# Patient Record
Sex: Male | Born: 1985 | Race: White | Hispanic: No | Marital: Single | Smoking: Never smoker
Health system: Southern US, Community
[De-identification: ages and names within clinical notes are randomized; demographics above are authoritative.]

## PROBLEM LIST (undated history)

## (undated) DIAGNOSIS — F988 Other specified behavioral and emotional disorders with onset usually occurring in childhood and adolescence: Secondary | ICD-10-CM

## (undated) DIAGNOSIS — S4990XA Unspecified injury of shoulder and upper arm, unspecified arm, initial encounter: Secondary | ICD-10-CM

## (undated) DIAGNOSIS — F329 Major depressive disorder, single episode, unspecified: Secondary | ICD-10-CM

## (undated) DIAGNOSIS — F32A Depression, unspecified: Secondary | ICD-10-CM

## (undated) HISTORY — PX: SHOULDER ARTHROSCOPY: SHX128

## (undated) HISTORY — DX: Unspecified injury of shoulder and upper arm, unspecified arm, initial encounter: S49.90XA

## (undated) HISTORY — DX: Other specified behavioral and emotional disorders with onset usually occurring in childhood and adolescence: F98.8

## (undated) HISTORY — DX: Major depressive disorder, single episode, unspecified: F32.9

## (undated) HISTORY — DX: Depression, unspecified: F32.A

---
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
## 2007-02-10 ENCOUNTER — Ambulatory Visit: Payer: Self-pay | Admitting: Family Medicine

## 2007-02-11 ENCOUNTER — Encounter: Admission: RE | Admit: 2007-02-11 | Discharge: 2007-02-11 | Payer: Self-pay | Admitting: Family Medicine

## 2007-03-19 DIAGNOSIS — J45990 Exercise induced bronchospasm: Secondary | ICD-10-CM

## 2007-04-16 ENCOUNTER — Encounter (INDEPENDENT_AMBULATORY_CARE_PROVIDER_SITE_OTHER): Payer: Self-pay | Admitting: Family Medicine

## 2007-06-03 ENCOUNTER — Ambulatory Visit: Payer: Self-pay | Admitting: Family Medicine

## 2007-06-03 DIAGNOSIS — R519 Headache, unspecified: Secondary | ICD-10-CM | POA: Insufficient documentation

## 2007-06-03 DIAGNOSIS — R55 Syncope and collapse: Secondary | ICD-10-CM

## 2007-06-03 DIAGNOSIS — R51 Headache: Secondary | ICD-10-CM

## 2007-06-27 ENCOUNTER — Ambulatory Visit: Payer: Self-pay | Admitting: Family Medicine

## 2007-06-27 DIAGNOSIS — F329 Major depressive disorder, single episode, unspecified: Secondary | ICD-10-CM | POA: Insufficient documentation

## 2007-07-09 ENCOUNTER — Telehealth (INDEPENDENT_AMBULATORY_CARE_PROVIDER_SITE_OTHER): Payer: Self-pay | Admitting: *Deleted

## 2007-07-14 ENCOUNTER — Encounter (INDEPENDENT_AMBULATORY_CARE_PROVIDER_SITE_OTHER): Payer: Self-pay | Admitting: Family Medicine

## 2010-06-09 ENCOUNTER — Ambulatory Visit: Payer: Self-pay | Admitting: Family Medicine

## 2010-06-09 DIAGNOSIS — F988 Other specified behavioral and emotional disorders with onset usually occurring in childhood and adolescence: Secondary | ICD-10-CM | POA: Insufficient documentation

## 2010-06-09 DIAGNOSIS — M6740 Ganglion, unspecified site: Secondary | ICD-10-CM | POA: Insufficient documentation

## 2010-07-03 DIAGNOSIS — Z9189 Other specified personal risk factors, not elsewhere classified: Secondary | ICD-10-CM | POA: Insufficient documentation

## 2010-10-26 ENCOUNTER — Ambulatory Visit
Admission: RE | Admit: 2010-10-26 | Discharge: 2010-10-26 | Payer: Self-pay | Source: Home / Self Care | Attending: Family Medicine | Admitting: Family Medicine

## 2010-11-05 ENCOUNTER — Encounter: Payer: Self-pay | Admitting: Family Medicine

## 2010-11-14 NOTE — Assessment & Plan Note (Signed)
Summary: CIST IN RIBS & TALK ABOUT MEDS / LFW   Vital Signs:  Patient profile:   25 year old male Height:      82 inches Weight:      255 pounds BMI:     26.76 Temp:     97.2 degrees F oral Pulse rate:   92 / minute Pulse rhythm:   regular BP sitting:   122 / 80  (left arm) Cuff size:   large  Vitals Entered By: Delilah Shan CMA Duncan Dull) (June 09, 2010 9:19 AM) CC: Cyst on wrist and talk about meds   History of Present Illness: Depression- longstanding for patient.  In therapy and doing much better on SSRI.  No SI/HI.  Sig prev upheaval when patient lost possible scholarships due to injury.  Also with uncle dead of suicide.  +FH of depression, other members of family have responded to celexa.  Winter tends to be worse for patient, but he is doing well now.  Work is going well for the patient.   ADD- prev tested and prev with good relief with meds.  Was on vyvanse prev.  No h/o abuse, misuse.  No illicit use.  Meds had prev helped with focus and were tolerated well.  No h/o HTN.  H/o speeding tickets; h/o lack of focus that is long standing and typical of ADD.  asking about starting back on meds.  The plan was to get MDD under better control and then address this.    H/o bilateral ganglion cysts on wrists.  Prev were inflammed and enlarged.  Recently decreased in size.  Asking about options.   Allergies (verified): No Known Drug Allergies  Past History:  Past Medical History: Depression ADD Shoulder injury  Family History: Reviewed history and no changes required. F alive, DM2, HTN, HLD, depression M alive Uncle dead, suicide  Social History: Reviewed history from 03/19/2007 and no changes required. Single Student Working at [employer], smoking. H/o dip tobacco. rare alcohol Was planning on attending college with basketball scholarship when shoulder injury happened.  [employer] fan  Review of Systems       See HPI.  Otherwise negative.    Physical  Exam  General:  GEN: nad, alert and oriented, affect wnl and appropriate HEENT: mucous membranes moist NECK: supple w/o LA CV: rrr.  PULM: ctab, no inc wob ABD: soft, +bs EXT: no edema CN 2-12 wnl, s/s/dtr wnl x4.  No tremor.   bilateral small ganglion cysts noted on the wrists, L>R.    Impression & Recommendations:  Problem # 1:  ADD (ICD-314.00) Will start vyvanse 10mg  by mouth qday and follow up in 1 month.  routine precautions given, esp UJ:WJXB changes.    Problem # 2:  DEPRESSION (ICD-311) continue celexa.  No change for now.  Will likely need to increase dose in the fall before winter symptoms change.  No SI/HI.  continue therapy. The following medications were removed from the medication list:    Zoloft 25 Mg Tabs (Sertraline hcl) .Marland Kitchen... Take 1 tablet by mouth once a day His updated medication list for this problem includes:    Celexa 20 Mg Tabs (Citalopram hydrobromide) .Marland Kitchen... 1 by mouth qday  Problem # 3:  GANGLION CYST (ICD-727.43) If they enlarge again, patient can call back and we'll refer to hand center via phone.   Complete Medication List: 1)  Celexa 20 Mg Tabs (Citalopram hydrobromide) .Marland Kitchen.. 1 by mouth qday 2)  Vyvanse 20 Mg Caps (Lisdexamfetamine  dimesylate) .Marland Kitchen.. 1 by mouth qday  Patient Instructions: 1)  Let me know if the cysts get bigger.  I want to see you back in 1 month. Let me know if you have any mood changes on the vyvanse.  Take care.  Prescriptions: VYVANSE 20 MG CAPS (LISDEXAMFETAMINE DIMESYLATE) 1 by mouth qday  #30 x 0   Entered and Authorized by:   Crawford Givens MD   Signed by:   Crawford Givens MD on 06/09/2010   Method used:   Print then Give to Patient   RxID:   [id number] CELEXA 20 MG TABS (CITALOPRAM HYDROBROMIDE) 1 by mouth qday  #90 x 3   Entered and Authorized by:   Crawford Givens MD   Signed by:   Crawford Givens MD on 06/09/2010   Method used:   Print then Give to Patient   RxID:   [id number]   Current Allergies (reviewed  today): No known allergies

## 2010-11-16 NOTE — Assessment & Plan Note (Signed)
Summary: COUGH,CONGESTION/CLE   Vital Signs:  Patient profile:   25 year old male Height:      82 inches Weight:      268.75 pounds BMI:     28.20 Temp:     97.8 degrees F oral Pulse rate:   80 / minute Pulse rhythm:   regular BP sitting:   98 / 68  (left arm) Cuff size:   large  Vitals Entered By: Delilah Shan CMA (AAMA) (October 26, 2010 9:30 AM) CC: Cough, congestion   History of Present Illness: "I though it was allergies, it was at random times but now it's more constant."  Waking up wheezing.  Has coughed a lot per patient, increase over last 2 weeks.  Using SABA frequently, with transient relief.  No smoking.  No FCNAV.  No abdominal pain.  Some HA recently.  Normally dry cough. Cough/wheeze worse at night.  Sx going on for about 1 month.    Allergies: No Known Drug Allergies  Past History:  Past Medical History: Depression ADD Shoulder injury H/o asthma  Review of Systems       See HPI.  Otherwise negative.    Physical Exam  General:  no apparent distress normocephalic atraumatic mucous membranes moist neck supple w/o LA regular rate and rhythm clear to auscultation bilaterally w/o wheeze noted, no inc in wob, dry cough noted abdomen soft, not tender to palpation ext w/o edema ext well perfused   Impression & Recommendations:  Problem # 1:  COUGH (ICD-786.2) He has a likely RAD exacerbation.  Lungs clear to auscultation bilaterally today.  Samples of symbicort given to use in meantime 160/4.5.  1 puff two times a day and use SABA as needed.  He'll call back with update.  he used first dose of symbicort here and cough was improved.  I talked with him about preventive vs abortive tx and he understood.  I see note reason to suspect a bacterial infection, no indication for antibiotics.  he is well appearing and doesn't appear to need systemic steroids.  follow up as needed.   Orders: Prescription Created Electronically [phone])  Complete Medication  List: 1)  Celexa 20 Mg Tabs (Citalopram hydrobromide) .Marland Kitchen.. 1 by mouth qday 2)  Vyvanse 20 Mg Caps (Lisdexamfetamine dimesylate) .Marland Kitchen.. 1 by mouth qday 3)  Symbicort 160-4.5 Mcg/act Aero (Budesonide-formoterol fumarate) .Marland Kitchen.. 1 puff two times a day 4)  Ventolin Hfa 108 (90 Base) Mcg/act Aers (Albuterol sulfate) .... 2 puffs q4h as needed for cough/wheeze  Patient Instructions: 1)  Use the symbicort two times a day and rinse after use.  Use the ventolin/albuterol every 4 hours as needed and let me know how you are doing in a few days.  Take care.   Prescriptions: VENTOLIN HFA 108 (90 BASE) MCG/ACT AERS (ALBUTEROL SULFATE) 2 puffs q4h as needed for cough/wheeze  #1 x 5   Entered and Authorized by:   Crawford Givens MD   Signed by:   Crawford Givens MD on 10/26/2010   Method used:   Electronically to        [facility] (retail)       [address]       [place], [place]  [address]       Ph: [phone]       Fax: [phone]   RxID:   [id number]    Orders Added: 1)  Est. Patient Level III [44010] 2)  Prescription Created  Electronically [phone]    Current Allergies (reviewed today): No known allergies

## 2011-03-02 NOTE — Assessment & Plan Note (Signed)
[facility]                        [facility] OFFICE NOTE   CARRY, ORTEZ                      MRN:          [id number]  DATE:02/10/2007                            DOB:          02-13-1986    REASON FOR VISIT:  Right shoulder pain.   Derek Briggs is an 25 year old male presenting today complaining of right  shoulder pain.  He reports that it has been present for 3 years, over  the last several months it has become a constant discomfort.  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  He does report that the  pain is definitely amplified with significant physical activity.  Sometimes he has difficulty getting his right arm over his head if he  does a lot of repetitive movement.  The pain initially started in the  anterior shoulder, now goes to the posterior shoulder into the trapezius  muscle.  He denies any obvious trauma.  He is not taking any medications  for it.  Occasionally he would ice it.   PAST MEDICAL HISTORY:  Asthma, mild, intermittent.   MEDICATIONS:  None.   ALLERGIES:  No drug allergies.   REVIEW OF SYSTEMS:  As per HPI.   OBJECTIVE:  Weight 201, temperature 97.9, pulse 72, blood pressure  122/80.  GENERAL:  A pleasant male in no acute distress.  RIGHT SHOULDER:  Examination significant for no obvious deformity on  physical inspection.  Palpation of the anterior, posterior shoulder  significant for no palpable masses, nontender to palpation.  External  rotation of the shoulder causes discomfort in posterior shoulder and  resisted abduction causes discomfort in the anterior shoulder. Strength  is 5 out of 5.  Neurovascularly intact.   IMPRESSION:  An 25 year old male with chronic right shoulder pain,  consistent with rotator cuff tendinopathy.  Need to rule out potential  rotator cuff tear.   PLAN:  We will obtain an x-ray of the shoulder, as well as the C-spine  to rule out obvious skeletal pathology.  Subsequently we will refer the  patient for an MRI  versus referral to after review of the x-ray.     Leanne Chang, M.D.  Electronically Signed    LA/MedQ  DD: 02/10/2007  DT: 02/11/2007  Job #: [id number]

## 2011-04-11 ENCOUNTER — Encounter: Payer: Self-pay | Admitting: Family Medicine

## 2011-04-11 ENCOUNTER — Ambulatory Visit (INDEPENDENT_AMBULATORY_CARE_PROVIDER_SITE_OTHER): Payer: 59 | Admitting: Family Medicine

## 2011-04-11 VITALS — BP 110/80 | HR 98 | Temp 98.2°F | Ht >= 80 in | Wt 267.0 lb

## 2011-04-11 DIAGNOSIS — J029 Acute pharyngitis, unspecified: Secondary | ICD-10-CM

## 2011-04-11 NOTE — Progress Notes (Signed)
Subjective:     Derek Briggs is a 25 y.o. male who presents for evaluation of sore throat. Associated symptoms include chills, headache, myalgias, post nasal drip, sinus and nasal congestion and sore throat. Onset of symptoms was 3 days ago, and have been unchanged since that time. He is drinking plenty of fluids. He has had a recent close exposure to someone with proven streptococcal pharyngitis (girlfriend).   Review of Systems Pertinent items are noted in HPI.  No fevers, nasal congestion, cough or chest pain. Pos nausea, no vomiting, no rashes  Patient Active Problem List  Diagnoses  . DEPRESSION  . ADD  . ASTHMA, EXERCISE INDUCED BRONCHOSPASM  . GANGLION CYST  . SYNCOPE  . HEADACHE  . CHICKENPOX, HX OF   Past Medical History  Diagnosis Date  . Depression   . ADD (attention deficit disorder)   . Shoulder injury   . Asthma    No past surgical history on file. History  Substance Use Topics  . Smoking status: Never Smoker   . Smokeless tobacco: Not on file  . Alcohol Use: Yes   Family History  Problem Relation Age of Onset  . Diabetes Father   . Hypertension Father   . Depression Father    The PMH, PSH, Social History, Family History, Medications, and allergies have been reviewed in [facility], and have been updated if relevant.     Objective:  BP 110/80  Pulse 98  Temp(Src) 98.2 F (36.8 C) (Oral)  Ht 6\' 10"  (2.083 m)  Wt 267 lb (121.11 kg)  BMI 27.92 kg/m2   BP 110/80  Pulse 98  Temp(Src) 98.2 F (36.8 C) (Oral)  Ht 6\' 10"  (2.083 m)  Wt 267 lb (121.11 kg)  BMI 27.92 kg/m2  General Appearance:    Alert, cooperative, no distress, appears stated age  Head:    Normocephalic, without obvious abnormality, atraumatic  Eyes:    PERRL, conjunctiva/corneas clear, EOM's intact, fundi    benign, both eyes       Ears:    Normal TM's and external ear canals, both ears  Nose:   Nares normal, septum midline, mucosa normal, no drainage    or sinus tenderness  Throat:    Lips, mucosa, and tongue normal; teeth and gums normal +erythema, no exudate  Neck:   Supple, symmetrical, trachea midline, no adenopathy;       thyroid:  No enlargement/tenderness/nodules; no carotid   bruit or JVD  Back:     Symmetric, no curvature, ROM normal, no CVA tenderness  Lungs:     Clear to auscultation bilaterally, respirations unlabored  Chest wall:    No tenderness or deformity  Heart:    Regular rate and rhythm, S1 and S2 normal, no murmur, rub   or gallop  Abdomen:     Soft, non-tender, bowel sounds active all four quadrants,    no masses, no organomegaly     Extremities:   Extremities normal, atraumatic, no cyanosis or edema  Pulses:   2+ and symmetric all extremities  Skin:   Skin color, texture, turgor normal, no rashes or lesions  Lymph nodes:   Cervical, supraclavicular, and axillary nodes normal  Neurologic:   CNII-XII intact. Normal strength, sensation and reflexes      throughout    Laboratory Strep test done. Results:negative.    Assessment:    Acute pharyngitis, likely  Viral pharyngitis.    Plan:    Use of OTC analgesics recommended as well as salt  water gargles. Follow up as needed.

## 2011-04-11 NOTE — Progress Notes (Signed)
Addended by: Gilmer Mor on: 04/11/2011 10:00 AM   Modules accepted: Orders

## 2011-04-12 ENCOUNTER — Telehealth: Payer: Self-pay | Admitting: *Deleted

## 2011-04-12 NOTE — Telephone Encounter (Signed)
I called and LMOVM for pt.  Will try again later.

## 2011-04-12 NOTE — Telephone Encounter (Signed)
Pt was seen yesterday for sore throat, he says he is not any better today and is asking that antibiotic be called to [facility].  He is willing to come back in for recheck, but there are no appts available today.  He doesn't sound like he feels well at all.

## 2011-04-12 NOTE — Telephone Encounter (Signed)
I called pt.  He doesn't sound toxic.  He does have h/o mono.  He's okay with coming in tomorrow.  I'll check him then.

## 2011-04-12 NOTE — Telephone Encounter (Signed)
Pt called back, he would like to be seen tomorrow, appt scheduled.

## 2011-04-13 ENCOUNTER — Encounter: Payer: Self-pay | Admitting: Family Medicine

## 2011-04-13 ENCOUNTER — Ambulatory Visit (INDEPENDENT_AMBULATORY_CARE_PROVIDER_SITE_OTHER): Payer: 59 | Admitting: Family Medicine

## 2011-04-13 VITALS — BP 120/80 | HR 76 | Temp 98.1°F | Resp 18 | Wt 266.0 lb

## 2011-04-13 DIAGNOSIS — J329 Chronic sinusitis, unspecified: Secondary | ICD-10-CM

## 2011-04-13 MED ORDER — AMOXICILLIN 875 MG PO TABS: 875.0000 mg | ORAL_TABLET | Freq: Two times a day (BID) | ORAL | Status: AC

## 2011-04-13 NOTE — Progress Notes (Signed)
URI sx.  Had chills, headache, myalgias, post nasal drip, sinus and nasal congestion and sore throat. Onset of symptoms was 5 days ago.  Still with sweats at night and chills, too.  He feels like his is equilibrium is off.  B temple pain.  Sinus pain and pressure, worse in the meantime.  No dip since Sunday.  He had mono before and had a lot of abdominal pain with that, but no sx like that now.  He feels worse than Wednesday overall, but the ST is some better.    Meds, vitals, and allergies reviewed.   ROS: See HPI.  Otherwise, noncontributory.  GEN: nad, alert and oriented HEENT: mucous membranes moist, tm w/o erythema but are retracted, nasal exam w/mild erythema, clear discharge noted,  OP with cobblestoning, frontal sinuses ttp medially NECK: supple w/ shotty LA CV: rrr.   PULM: ctab, no inc wob ABD: soft, not ttp, normal BS EXT: no edema SKIN: no acute rash

## 2011-04-13 NOTE — Patient Instructions (Signed)
Drink plenty of fluids, take tylenol as needed, and gargle with warm salt water for your throat.  Start the amoxil today.  This should gradually improve.  Take care.  Let us know if you have other concerns.

## 2011-04-15 ENCOUNTER — Encounter: Payer: Self-pay | Admitting: Family Medicine

## 2011-04-15 DIAGNOSIS — J329 Chronic sinusitis, unspecified: Secondary | ICD-10-CM | POA: Insufficient documentation

## 2011-04-15 NOTE — Assessment & Plan Note (Signed)
He has progression of symptoms with sinus tenderness.  I doubt mono. I would start the abx and supportive tx o/w.  He agrees.  D/w pt about ddx and he understood.  Nontoxic.

## 2011-12-04 ENCOUNTER — Ambulatory Visit (INDEPENDENT_AMBULATORY_CARE_PROVIDER_SITE_OTHER): Payer: 59 | Admitting: Family Medicine

## 2011-12-04 ENCOUNTER — Ambulatory Visit (INDEPENDENT_AMBULATORY_CARE_PROVIDER_SITE_OTHER)
Admission: RE | Admit: 2011-12-04 | Discharge: 2011-12-04 | Disposition: A | Discharge status: Home / Self Care | Payer: 59 | Source: Ambulatory Visit | Attending: Family Medicine | Admitting: Family Medicine

## 2011-12-04 ENCOUNTER — Encounter: Payer: Self-pay | Admitting: Family Medicine

## 2011-12-04 DIAGNOSIS — M25539 Pain in unspecified wrist: Secondary | ICD-10-CM

## 2011-12-04 DIAGNOSIS — J45990 Exercise induced bronchospasm: Secondary | ICD-10-CM

## 2011-12-04 DIAGNOSIS — M25519 Pain in unspecified shoulder: Secondary | ICD-10-CM

## 2011-12-04 DIAGNOSIS — F988 Other specified behavioral and emotional disorders with onset usually occurring in childhood and adolescence: Secondary | ICD-10-CM

## 2011-12-04 MED ORDER — ALBUTEROL SULFATE HFA 108 (90 BASE) MCG/ACT IN AERS: 2.0000 | INHALATION_SPRAY | RESPIRATORY_TRACT | Status: DC | PRN

## 2011-12-04 MED ORDER — LISDEXAMFETAMINE DIMESYLATE 20 MG PO CAPS: 20.0000 mg | ORAL_CAPSULE | Freq: Every day | ORAL | Status: DC

## 2011-12-04 MED ORDER — BUDESONIDE-FORMOTEROL FUMARATE 160-4.5 MCG/ACT IN AERO: 1.0000 | INHALATION_SPRAY | Freq: Two times a day (BID) | RESPIRATORY_TRACT | Status: DC
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
## 2011-12-04 NOTE — Patient Instructions (Signed)
Use and over the counter wrist brace and take 600mg  of ibuprofen 3 times a day with food.  This should get better.  Take care.

## 2011-12-06 DIAGNOSIS — M25539 Pain in unspecified wrist: Secondary | ICD-10-CM | POA: Insufficient documentation

## 2011-12-06 DIAGNOSIS — M25519 Pain in unspecified shoulder: Secondary | ICD-10-CM | POA: Insufficient documentation

## 2011-12-06 NOTE — Assessment & Plan Note (Signed)
No fx seen, use otc cock up brace and activity limited by pain.  Ibuprofen prn with GI caution.  F/u prn.  Xray w/o acute changes.

## 2011-12-06 NOTE — Assessment & Plan Note (Signed)
Controlled with meds, continue as is, no change.

## 2011-12-06 NOTE — Assessment & Plan Note (Signed)
Controlled, continue as is, no change in meds.  

## 2011-12-06 NOTE — Assessment & Plan Note (Signed)
Likely strained, but xray reviewed and no acute changes.  Use ibuprofen prn and f/u prn.  Activity to be limited by pain.

## 2011-12-14 ENCOUNTER — Encounter: Payer: 59 | Admitting: Family Medicine

## 2012-01-30 ENCOUNTER — Other Ambulatory Visit: Payer: Self-pay | Admitting: Orthopedic Surgery

## 2012-01-30 ENCOUNTER — Ambulatory Visit
Admission: RE | Admit: 2012-01-30 | Discharge: 2012-01-30 | Disposition: A | Discharge status: Home / Self Care | Payer: 59 | Source: Ambulatory Visit | Attending: Orthopedic Surgery | Admitting: Orthopedic Surgery

## 2012-01-30 DIAGNOSIS — R52 Pain, unspecified: Secondary | ICD-10-CM

## 2012-01-31 ENCOUNTER — Encounter (HOSPITAL_COMMUNITY): Payer: Self-pay | Admitting: Pharmacy Technician

## 2012-01-31 ENCOUNTER — Encounter (HOSPITAL_COMMUNITY)
Admission: RE | Admit: 2012-01-31 | Discharge: 2012-01-31 | Disposition: A | Discharge status: Home / Self Care | Payer: 59 | Source: Ambulatory Visit | Attending: Anesthesiology | Admitting: Anesthesiology

## 2012-01-31 ENCOUNTER — Other Ambulatory Visit: Payer: Self-pay | Admitting: Orthopedic Surgery

## 2012-01-31 ENCOUNTER — Encounter (HOSPITAL_COMMUNITY): Payer: Self-pay

## 2012-01-31 ENCOUNTER — Encounter (HOSPITAL_COMMUNITY)
Admission: RE | Admit: 2012-01-31 | Discharge: 2012-01-31 | Disposition: A | Discharge status: Home / Self Care | Payer: 59 | Source: Ambulatory Visit | Attending: Orthopedic Surgery | Admitting: Orthopedic Surgery

## 2012-01-31 LAB — CBC
Hemoglobin: 15.2 g/dL (ref 13.0–17.0)
MCH: 29.9 pg (ref 26.0–34.0)
MCHC: 34.3 g/dL (ref 30.0–36.0)
MCV: 87 fL (ref 78.0–100.0)
RBC: 5.09 MIL/uL (ref 4.22–5.81)

## 2012-01-31 LAB — SURGICAL PCR SCREEN: MRSA, PCR: NEGATIVE

## 2012-02-01 ENCOUNTER — Encounter (HOSPITAL_COMMUNITY): Payer: Self-pay | Admitting: Anesthesiology

## 2012-02-01 ENCOUNTER — Ambulatory Visit (HOSPITAL_COMMUNITY): Payer: 59 | Admitting: Anesthesiology

## 2012-02-01 ENCOUNTER — Encounter (HOSPITAL_COMMUNITY)
Admission: RE | Disposition: A | Discharge status: Home / Self Care | Payer: Self-pay | Source: Ambulatory Visit | Attending: Orthopedic Surgery

## 2012-02-01 ENCOUNTER — Inpatient Hospital Stay (HOSPITAL_COMMUNITY)
Admission: RE | Admit: 2012-02-01 | Discharge: 2012-02-02 | DRG: 502 | Disposition: A | Discharge status: Home / Self Care | Payer: 59 | Source: Ambulatory Visit | Attending: Orthopedic Surgery | Admitting: Orthopedic Surgery

## 2012-02-01 DIAGNOSIS — S52539A Colles' fracture of unspecified radius, initial encounter for closed fracture: Principal | ICD-10-CM | POA: Diagnosis present

## 2012-02-01 DIAGNOSIS — F3289 Other specified depressive episodes: Secondary | ICD-10-CM | POA: Diagnosis present

## 2012-02-01 DIAGNOSIS — X58XXXA Exposure to other specified factors, initial encounter: Secondary | ICD-10-CM | POA: Diagnosis present

## 2012-02-01 DIAGNOSIS — F329 Major depressive disorder, single episode, unspecified: Secondary | ICD-10-CM | POA: Diagnosis present

## 2012-02-01 DIAGNOSIS — J45909 Unspecified asthma, uncomplicated: Secondary | ICD-10-CM | POA: Diagnosis present

## 2012-02-01 DIAGNOSIS — F988 Other specified behavioral and emotional disorders with onset usually occurring in childhood and adolescence: Secondary | ICD-10-CM | POA: Diagnosis present

## 2012-02-01 HISTORY — PX: ORIF WRIST FRACTURE: SHX2133

## 2012-02-01 SURGERY — OPEN REDUCTION INTERNAL FIXATION (ORIF) WRIST FRACTURE
Anesthesia: General | Site: Arm Lower | Laterality: Right | Wound class: Clean

## 2012-02-01 MED ORDER — ALBUTEROL SULFATE HFA 108 (90 BASE) MCG/ACT IN AERS
2.0000 | INHALATION_SPRAY | RESPIRATORY_TRACT | Status: DC | PRN
  Filled 2012-02-01: qty 6.7

## 2012-02-01 MED ORDER — HYDROMORPHONE HCL PF 1 MG/ML IJ SOLN
0.2500 mg | INTRAMUSCULAR | Status: DC | PRN
  Administered 2012-02-01 (×2): 0.5 mg via INTRAVENOUS

## 2012-02-01 MED ORDER — BUDESONIDE-FORMOTEROL FUMARATE 160-4.5 MCG/ACT IN AERO
1.0000 | INHALATION_SPRAY | Freq: Two times a day (BID) | RESPIRATORY_TRACT | Status: DC | PRN
  Filled 2012-02-01: qty 6

## 2012-02-01 MED ORDER — MORPHINE SULFATE 2 MG/ML IJ SOLN
1.0000 mg | INTRAMUSCULAR | Status: DC | PRN
  Administered 2012-02-01: 2 mg via INTRAVENOUS
  Filled 2012-02-01: qty 1

## 2012-02-01 MED ORDER — METHOCARBAMOL 500 MG PO TABS: 500.0000 mg | ORAL_TABLET | Freq: Four times a day (QID) | ORAL | Status: DC | PRN

## 2012-02-01 MED ORDER — ALPRAZOLAM 0.5 MG PO TABS: 0.5000 mg | ORAL_TABLET | Freq: Four times a day (QID) | ORAL | Status: DC | PRN

## 2012-02-01 MED ORDER — DOCUSATE SODIUM 100 MG PO CAPS
100.0000 mg | ORAL_CAPSULE | Freq: Two times a day (BID) | ORAL | Status: DC
  Administered 2012-02-01 – 2012-02-02 (×2): 100 mg via ORAL
  Filled 2012-02-01 (×2): qty 1

## 2012-02-01 MED ORDER — ONDANSETRON HCL 4 MG/2ML IJ SOLN
INTRAMUSCULAR | Status: DC | PRN
  Administered 2012-02-01: 4 mg via INTRAVENOUS

## 2012-02-01 MED ORDER — FENTANYL CITRATE 0.05 MG/ML IJ SOLN
INTRAMUSCULAR | Status: DC | PRN
  Administered 2012-02-01 (×4): 50 ug via INTRAVENOUS

## 2012-02-01 MED ORDER — LISDEXAMFETAMINE DIMESYLATE 20 MG PO CAPS
20.0000 mg | ORAL_CAPSULE | Freq: Every day | ORAL | Status: DC
Start: 2012-02-02 — End: 2012-02-02
  Administered 2012-02-02: 20 mg via ORAL
  Filled 2012-02-01: qty 1

## 2012-02-01 MED ORDER — VITAMIN C 500 MG PO TABS
1000.0000 mg | ORAL_TABLET | Freq: Every day | ORAL | Status: DC
  Administered 2012-02-02: 1000 mg via ORAL
  Filled 2012-02-01: qty 2

## 2012-02-01 MED ORDER — CEFAZOLIN SODIUM 1-5 GM-% IV SOLN
1.0000 g | INTRAVENOUS | Status: AC
  Administered 2012-02-01: 1 g via INTRAVENOUS

## 2012-02-01 MED ORDER — LIDOCAINE HCL (CARDIAC) 20 MG/ML IV SOLN
INTRAVENOUS | Status: DC | PRN
  Administered 2012-02-01: 60 mg via INTRAVENOUS

## 2012-02-01 MED ORDER — MUPIROCIN 2 % EX OINT
TOPICAL_OINTMENT | CUTANEOUS | Status: AC
  Filled 2012-02-01: qty 22

## 2012-02-01 MED ORDER — ONDANSETRON HCL 4 MG PO TABS: 4.0000 mg | ORAL_TABLET | Freq: Four times a day (QID) | ORAL | Status: DC | PRN

## 2012-02-01 MED ORDER — BUPIVACAINE HCL (PF) 0.25 % IJ SOLN
INTRAMUSCULAR | Status: DC | PRN
  Administered 2012-02-01: 10 mL

## 2012-02-01 MED ORDER — ONDANSETRON HCL 4 MG/2ML IJ SOLN: 4.0000 mg | Freq: Four times a day (QID) | INTRAMUSCULAR | Status: DC | PRN

## 2012-02-01 MED ORDER — CHLORHEXIDINE GLUCONATE 4 % EX LIQD: 60.0000 mL | Freq: Once | CUTANEOUS | Status: DC

## 2012-02-01 MED ORDER — MIDAZOLAM HCL 5 MG/5ML IJ SOLN
INTRAMUSCULAR | Status: DC | PRN
  Administered 2012-02-01: 2 mg via INTRAVENOUS

## 2012-02-01 MED ORDER — CEFAZOLIN SODIUM 1-5 GM-% IV SOLN
INTRAVENOUS | Status: AC
  Filled 2012-02-01: qty 50

## 2012-02-01 MED ORDER — PROMETHAZINE HCL 25 MG RE SUPP: 12.5000 mg | Freq: Four times a day (QID) | RECTAL | Status: DC | PRN

## 2012-02-01 MED ORDER — PROPOFOL 10 MG/ML IV EMUL
INTRAVENOUS | Status: DC | PRN
  Administered 2012-02-01: 200 mg via INTRAVENOUS

## 2012-02-01 MED ORDER — LACTATED RINGERS IV SOLN
INTRAVENOUS | Status: DC | PRN
  Administered 2012-02-01 (×2): via INTRAVENOUS

## 2012-02-01 MED ORDER — CEFAZOLIN SODIUM 1-5 GM-% IV SOLN
1.0000 g | Freq: Three times a day (TID) | INTRAVENOUS | Status: DC
  Administered 2012-02-02 (×2): 1 g via INTRAVENOUS
  Filled 2012-02-01 (×4): qty 50

## 2012-02-01 MED ORDER — LACTATED RINGERS IV SOLN: INTRAVENOUS | Status: DC

## 2012-02-01 MED ORDER — FAMOTIDINE 20 MG PO TABS
20.0000 mg | ORAL_TABLET | Freq: Two times a day (BID) | ORAL | Status: DC | PRN
  Filled 2012-02-01: qty 1

## 2012-02-01 MED ORDER — METHOCARBAMOL 100 MG/ML IJ SOLN
500.0000 mg | Freq: Four times a day (QID) | INTRAVENOUS | Status: DC | PRN
  Filled 2012-02-01: qty 5

## 2012-02-01 MED ORDER — CEFAZOLIN SODIUM 1-5 GM-% IV SOLN
1.0000 g | INTRAVENOUS | Status: AC
  Administered 2012-02-01: 1 g via INTRAVENOUS
  Filled 2012-02-01: qty 50

## 2012-02-01 MED ORDER — ACETAMINOPHEN 10 MG/ML IV SOLN
INTRAVENOUS | Status: DC | PRN
  Administered 2012-02-01: 1000 mg via INTRAVENOUS

## 2012-02-01 MED ORDER — ACETAMINOPHEN 10 MG/ML IV SOLN
INTRAVENOUS | Status: AC
  Filled 2012-02-01: qty 100

## 2012-02-01 MED ORDER — OXYCODONE HCL 5 MG PO TABS: 5.0000 mg | ORAL_TABLET | ORAL | Status: DC | PRN

## 2012-02-01 SURGICAL SUPPLY — 60 items
BANDAGE ELASTIC 3 VELCRO ST LF (GAUZE/BANDAGES/DRESSINGS) ×2 IMPLANT
BANDAGE ELASTIC 4 VELCRO ST LF (GAUZE/BANDAGES/DRESSINGS) ×2 IMPLANT
BANDAGE GAUZE ELAST BULKY 4 IN (GAUZE/BANDAGES/DRESSINGS) ×2 IMPLANT
BIT DRILL 2 FAST STEP (BIT) ×2 IMPLANT
BLADE SURG ROTATE 9660 (MISCELLANEOUS) IMPLANT
BNDG ESMARK 4X9 LF (GAUZE/BANDAGES/DRESSINGS) ×2 IMPLANT
CLOTH BEACON ORANGE TIMEOUT ST (SAFETY) ×2 IMPLANT
CORDS BIPOLAR (ELECTRODE) ×2 IMPLANT
COVER SURGICAL LIGHT HANDLE (MISCELLANEOUS) ×2 IMPLANT
CUFF TOURNIQUET SINGLE 18IN (TOURNIQUET CUFF) IMPLANT
CUFF TOURNIQUET SINGLE 24IN (TOURNIQUET CUFF) ×2 IMPLANT
DECANTER SPIKE VIAL GLASS SM (MISCELLANEOUS) IMPLANT
DRAIN TLS ROUND 10FR (DRAIN) IMPLANT
DRAPE INCISE IOBAN 66X45 STRL (DRAPES) IMPLANT
DRAPE OEC MINIVIEW 54X84 (DRAPES) ×2 IMPLANT
DRAPE U-SHAPE 47X51 STRL (DRAPES) ×2 IMPLANT
DRSG ADAPTIC 3X8 NADH LF (GAUZE/BANDAGES/DRESSINGS) ×2 IMPLANT
ELECT REM PT RETURN 9FT ADLT (ELECTROSURGICAL)
ELECTRODE REM PT RTRN 9FT ADLT (ELECTROSURGICAL) IMPLANT
GAUZE XEROFORM 1X8 LF (GAUZE/BANDAGES/DRESSINGS) ×2 IMPLANT
GAUZE XEROFORM 5X9 LF (GAUZE/BANDAGES/DRESSINGS) ×2 IMPLANT
GLOVE ORTHO TXT STRL SZ7.5 (GLOVE) ×2 IMPLANT
GLOVE SS BIOGEL STRL SZ 8 (GLOVE) ×2 IMPLANT
GLOVE SUPERSENSE BIOGEL SZ 8 (GLOVE) ×2
GOWN PREVENTION PLUS XLARGE (GOWN DISPOSABLE) ×2 IMPLANT
GOWN STRL NON-REIN LRG LVL3 (GOWN DISPOSABLE) ×6 IMPLANT
GOWN STRL REIN XL XLG (GOWN DISPOSABLE) IMPLANT
High Flex Right Plate ×2 IMPLANT
KIT BASIN OR (CUSTOM PROCEDURE TRAY) ×2 IMPLANT
KIT ROOM TURNOVER OR (KITS) ×2 IMPLANT
KWIRE 4.0 X .062IN (WIRE) ×6 IMPLANT
LOOP VESSEL MAXI BLUE (MISCELLANEOUS) ×2 IMPLANT
MANIFOLD NEPTUNE II (INSTRUMENTS) ×2 IMPLANT
NEEDLE 22X1 1/2 (OR ONLY) (NEEDLE) IMPLANT
NEEDLE BLUNT 16X1.5 OR ONLY (NEEDLE) IMPLANT
NS IRRIG 1000ML POUR BTL (IV SOLUTION) ×2 IMPLANT
PACK ORTHO EXTREMITY (CUSTOM PROCEDURE TRAY) ×2 IMPLANT
PAD ARMBOARD 7.5X6 YLW CONV (MISCELLANEOUS) ×4 IMPLANT
PAD CAST 4YDX4 CTTN HI CHSV (CAST SUPPLIES) ×1 IMPLANT
PADDING CAST ABS 4INX4YD NS (CAST SUPPLIES) ×1
PADDING CAST ABS COTTON 4X4 ST (CAST SUPPLIES) ×1 IMPLANT
PADDING CAST COTTON 4X4 STRL (CAST SUPPLIES) ×1
SCREW CANC NONLOCK 2.5X26 (Screw) ×2 IMPLANT
SCREW PEG 2.5X18 NONLOCK (Screw) ×2 IMPLANT
SCREW PEG 2.5X20 NONLOCK (Screw) ×4 IMPLANT
SPONGE GAUZE 4X4 12PLY (GAUZE/BANDAGES/DRESSINGS) ×2 IMPLANT
SPONGE LAP 4X18 X RAY DECT (DISPOSABLE) ×2 IMPLANT
STAPLER VISISTAT 35W (STAPLE) IMPLANT
SUCTION FRAZIER TIP 10 FR DISP (SUCTIONS) ×2 IMPLANT
SUT ETHILON 4 0 PS 2 18 (SUTURE) IMPLANT
SUT PROLENE 4 0 PS 2 18 (SUTURE) ×6 IMPLANT
SUT VIC AB 3-0 FS2 27 (SUTURE) ×2 IMPLANT
SYR CONTROL 10ML LL (SYRINGE) IMPLANT
SYSTEM CHEST DRAIN TLS 7FR (DRAIN) IMPLANT
TOWEL OR 17X24 6PK STRL BLUE (TOWEL DISPOSABLE) ×2 IMPLANT
TOWEL OR 17X26 10 PK STRL BLUE (TOWEL DISPOSABLE) ×2 IMPLANT
TUBE CONNECTING 12X1/4 (SUCTIONS) ×2 IMPLANT
TUBE EVACUATION TLS (MISCELLANEOUS) ×2 IMPLANT
WATER STERILE IRR 1000ML POUR (IV SOLUTION) ×2 IMPLANT
YANKAUER SUCT BULB TIP NO VENT (SUCTIONS) IMPLANT

## 2012-02-01 NOTE — OR Nursing (Signed)
Biological came back negative.

## 2012-02-01 NOTE — H&P (Signed)
Derek Briggs is an 26 y.o. male.   Chief Complaint: Patient presents for ORIF right wrist HPI: Patient presents for ORIF right wrist ..Patient presents for evaluation and treatment of the of their upper extremity predicament. The patient denies neck back chest or of abdominal pain. The patient notes that they have no lower extremity problems. The patient from primarily complains of the upper extremity pain noted.  Past Medical History  Diagnosis Date  . Depression   . ADD (attention deficit disorder)   . Shoulder injury   . Asthma     Past Surgical History  Procedure Date  . Shoulder arthroscopy     Family History  Problem Relation Age of Onset  . Diabetes Father   . Hypertension Father   . Depression Father    Social History:  reports that he has never smoked. He does not have any smokeless tobacco history on file. He reports that he drinks alcohol. He reports that he does not use illicit drugs.  Allergies: No Known Allergies  Medications Prior to Admission  Medication Dose Route Frequency Provider Last Rate Last Dose  . ceFAZolin (ANCEF) 1-5 GM-% IVPB           . ceFAZolin (ANCEF) IVPB 1 g/50 mL premix  1 g Intravenous 60 min Pre-Op Sheran Lawless, PA      . chlorhexidine (HIBICLENS) 4 % liquid 4 application  60 mL Topical Once Sheran Lawless, PA      . mupirocin ointment (BACTROBAN) 2 %            Medications Prior to Admission  Medication Sig Dispense Refill  . albuterol (PROVENTIL HFA;VENTOLIN HFA) 108 (90 BASE) MCG/ACT inhaler Inhale 2 puffs into the lungs every 4 (four) hours as needed. For shortness of breath      . budesonide-formoterol (SYMBICORT) 160-4.5 MCG/ACT inhaler Inhale 1 puff into the lungs 2 (two) times daily as needed. For shortness of breath        Results for orders placed during the hospital encounter of 01/31/12 (from the past 48 hour(s))  SURGICAL PCR SCREEN     Status: Abnormal   Collection Time   01/31/12  3:47 PM      Component Value  Range Comment   MRSA, PCR NEGATIVE  NEGATIVE     Staphylococcus aureus POSITIVE (*) NEGATIVE    CBC     Status: Normal   Collection Time   01/31/12  4:00 PM      Component Value Range Comment   WBC 10.5  4.0 - 10.5 (K/uL)    RBC 5.09  4.22 - 5.81 (MIL/uL)    Hemoglobin 15.2  13.0 - 17.0 (g/dL)    HCT 95.2  84.1 - 32.4 (%)    MCV 87.0  78.0 - 100.0 (fL)    MCH 29.9  26.0 - 34.0 (pg)    MCHC 34.3  30.0 - 36.0 (g/dL)    RDW 40.1  02.7 - 25.3 (%)    Platelets 241  150 - 400 (K/uL)    Dg Chest 2 View  01/31/2012  *RADIOLOGY REPORT*  Clinical Data: Preop evaluation for wrist surgery, history of asthma  CHEST - 2 VIEW  Comparison: 01/17/2005  Findings: Normal heart size and vascularity.  No focal pneumonia, collapse, consolidation, effusion, or pneumothorax.  Trachea midline.  IMPRESSION: No acute chest finding  Original Report Authenticated By: Judie Petit. Ruel Favors, M.D.    Review of Systems  Constitutional: Negative.   HENT: Negative.  Eyes: Negative.   Respiratory: Negative.   Cardiovascular: Negative.   Gastrointestinal: Negative.   Genitourinary: Negative.   Skin: Negative.   Neurological: Negative.   Psychiatric/Behavioral: Negative.     Blood pressure 115/67, pulse 76, temperature 98.1 F (36.7 C), temperature source Oral, resp. rate 16, height 6\' 10"  (2.083 m), weight 122.471 kg (270 lb), SpO2 98.00%. Physical Exam ..The patient is alert and oriented in no acute distress the patient complains of pain in the affected upper extremity. The patient is noted to have a normal HEENT exam. Lung fields show equal chest expansion and no shortness of breath abdomen exam is nontender without distention. Lower extremity examination does not show any fracture dislocation or blood clot symptoms. Pelvis is stable neck and back are stable and nontender  Patient with fracture dislocation right wrist Normal sensation and refill  Assessment/Plan .We are planning surgery for your upper extremity.  The risk and benefits of surgery include risk of bleeding infection anesthesia damage to normal structures and failure of the surgery to accomplish its intended goals of relieving symptoms and restoring function with this in mind we'll going to proceed. I have specifically discussed with the patient the pre-and postoperative regime and the does and don'ts and risk and benefits in great detail. Risk and benefits of surgery also include risk of dystrophy chronic nerve pain failure of the healing process to go onto completion and other inherent risks of surgery The relavent the pathophysiology of the disease/injury process, as well as the alternatives for treatment and postoperative course of action has been discussed in great detail with the patient who desires to proceed.  We will do everything in our power to help you (the patient) restore function to the upper extremity. Is a pleasure to see this patient today.   Karen Chafe 02/01/2012, 6:15 PM

## 2012-02-01 NOTE — Op Note (Signed)
Dictation # [id number] Please see dictation Dominica Severin MD

## 2012-02-01 NOTE — Anesthesia Procedure Notes (Signed)
Procedure Name: LMA Insertion Date/Time: 02/01/2012 6:59 PM Performed by: Arlice Colt B Pre-anesthesia Checklist: Patient identified, Timeout performed, Emergency Drugs available, Suction available and Patient being monitored Patient Re-evaluated:Patient Re-evaluated prior to inductionOxygen Delivery Method: Circle system utilized Preoxygenation: Pre-oxygenation with 100% oxygen Intubation Type: IV induction LMA: LMA inserted LMA Size: 5.0 Number of attempts: 1 Placement Confirmation: positive ETCO2 and breath sounds checked- equal and bilateral Tube secured with: Tape Dental Injury: Teeth and Oropharynx as per pre-operative assessment

## 2012-02-01 NOTE — Anesthesia Postprocedure Evaluation (Signed)
  Anesthesia Post-op Note  Patient: Derek Briggs  Procedure(s) Performed: Procedure(s) (LRB): OPEN REDUCTION INTERNAL FIXATION (ORIF) WRIST FRACTURE (Right)  Patient Location: PACU and SICU  Anesthesia Type: General  Level of Consciousness: awake  Airway and Oxygen Therapy: Patient remains intubated per anesthesia plan  Post-op Pain: mild  Post-op Assessment: Post-op Vital signs reviewed  Post-op Vital Signs: Reviewed  Complications: No apparent anesthesia complications

## 2012-02-01 NOTE — Transfer of Care (Signed)
Immediate Anesthesia Transfer of Care Note  Patient: Derek Briggs  Procedure(s) Performed: Procedure(s) (LRB): OPEN REDUCTION INTERNAL FIXATION (ORIF) WRIST FRACTURE (Right)  Patient Location: PACU  Anesthesia Type: General  Level of Consciousness: awake, alert  and oriented  Airway & Oxygen Therapy: Patient Spontanous Breathing and Patient connected to nasal cannula oxygen  Post-op Assessment: Report given to PACU RN, Post -op Vital signs reviewed and stable and Patient moving all extremities  Post vital signs: Reviewed and stable  Complications: No apparent anesthesia complications

## 2012-02-01 NOTE — OR Nursing (Signed)
February 01, 2012 1942  Room 3, Load 2 of autoclave #4 Hand Set ran for 10 minutes put in at 1833 by Sult RN and removed by Judyann Munson RN and Gregary Signs CST at [phone].  Biological given to Earley Favor RN evening shift charge and informed it needed to be ran and we needed the results.  Judyann Munson RN

## 2012-02-01 NOTE — Preoperative (Signed)
Beta Blockers   Reason not to administer Beta Blockers:Not Applicable 

## 2012-02-01 NOTE — Anesthesia Preprocedure Evaluation (Addendum)
Anesthesia Evaluation  Patient identified by MRN, date of birth, ID band Patient awake    Reviewed: Allergy & Precautions, H&P , NPO status , Patient's Chart, lab work & pertinent test results  Airway Mallampati: II TM Distance: >3 FB Neck ROM: full    Dental  (+) Teeth Intact and Dental Advisory Given   Pulmonary asthma ,  Albuterol inhaler and "Steroid" inhaler prn   Pulmonary exam normal       Cardiovascular negative cardio ROS  Rhythm:Regular     Neuro/Psych PSYCHIATRIC DISORDERS Depression ADD negative neurological ROS     GI/Hepatic negative GI ROS, Neg liver ROS,   Endo/Other  negative endocrine ROS  Renal/GU negative Renal ROS     Musculoskeletal negative musculoskeletal ROS (+)   Abdominal   Peds  Hematology negative hematology ROS (+)   Anesthesia Other Findings   Reproductive/Obstetrics negative OB ROS                         Anesthesia Physical Anesthesia Plan  ASA: II  Anesthesia Plan: General   Post-op Pain Management:    Induction: Intravenous  Airway Management Planned: LMA  Additional Equipment:   Intra-op Plan:   Post-operative Plan:   Informed Consent: I have reviewed the patients History and Physical, chart, labs and discussed the procedure including the risks, benefits and alternatives for the proposed anesthesia with the patient or authorized representative who has indicated his/her understanding and acceptance.   Dental advisory given  Plan Discussed with: CRNA and Surgeon  Anesthesia Plan Comments:        Anesthesia Quick Evaluation

## 2012-02-02 ENCOUNTER — Encounter (HOSPITAL_COMMUNITY): Payer: Self-pay | Admitting: General Practice

## 2012-02-02 NOTE — Discharge Summary (Signed)
  Please see postop day 1 note. Patient looks excellent he is ready to be discharged home. His nerve vascularly intact. Abdomen is soft and there no complications today.Marland KitchenMarland KitchenThe patient is alert and oriented in no acute distress the patient complains of pain in the affected upper extremity. The patient is noted to have a normal HEENT exam. Lung fields show equal chest expansion and no shortness of breath abdomen exam is nontender without distention. Lower extremity examination does not show any fracture dislocation or blood clot symptoms. Pelvis is stable neck and back are stable and nontender   .Marland KitchenKeep bandage clean and dry.  Call for any problems.  No smoking.  Criteria for driving a car: you should be off your pain medicine for 7-8 hours, able to drive one handed(confident), thinking clearly and feeling able in your judgement to drive. Continue elevation as it will decrease swelling.  If instructed by MD move your fingers within the confines of the bandage/splint.  Use ice if instructed by your MD. Call immediately for any sudden loss of feeling in your hand/arm or change in functional abilities of the extremity. Marland Kitchen.We recommend that you to take vitamin C 1000 mg a day to promote healing we also recommend that if you require her pain medicine that he take a stool softener to prevent constipation as most pain medicines will have constipation side effects. We recommend either Peri-Colace or Senokot and recommend that you also consider adding MiraLAX to prevent the constipation affects from pain medicine if you are required to use them. These medicines are over the counter and maybe purchased at a local pharmacy.  Patient will follow up in 10-13 days  Dominica Severin MD

## 2012-02-02 NOTE — Op Note (Signed)
Derek Briggs, Derek Briggs NO.:  [id number]  MEDICAL RECORD NO.:  [id number]  LOCATION:  5001                         FACILITY:  [facility]  PHYSICIAN:  Dionne Ano. Brockton Mckesson, M.D.DATE OF BIRTH:  01/24/1986  DATE OF PROCEDURE: DATE OF DISCHARGE:                              OPERATIVE REPORT   PREOPERATIVE DIAGNOSIS:  Comminuted complex fracture-dislocation, right radius distally.  POSTOPERATIVE DIAGNOSIS:  Comminuted complex fracture-dislocation, right radius distally.  PROCEDURES: 1. Open reduction and internal fixation, comminuted complex fracture-     dislocation greater than three-part right distal radius fracture. 2. Extensor pollicis longus decompression and anterior subcutaneous     transposition placement/tendon transfer, right wrist. 3. Posterior interosseous nerve neurectomy, right wrist. 4. Stress radiography.  SURGEON:  Dionne Ano. Amanda Pea, MD  ASSISTANT:  None.  COMPLICATIONS:  None.  ANESTHESIA:  General.  TOURNIQUET TIME:  Less than an hour.  INDICATIONS FOR THE PROCEDURE:  This patient is a pleasant male, presented to my office with a fracture-dislocation to his right radius. He underwent a closed reduction, CT scan, and x-ray.  I counseled him in regard to risks and benefits of surgery including risk of infection, bleeding, anesthesia, damage to normal structures, and failure of surgery to accomplish its intended goals of relieving symptoms and restoring function.  With this in mind, he desires to proceed.  Our goal is to try to give him the best wrist as possible.  Preoperatively, his median nerve was intact; however, we are going to keep a very close eye on this as well as his swelling patterns he notes.  OPERATION DETAILS:  The patient was seen by myself and Anesthesia, taken to the operative suite, and underwent smooth induction of general anesthesia.  Time-out called.  Preop and postop check list completed. He was prepped and draped in usual  sterile fashion about the right upper extremity.  I actually performed the prep and drape myself given the comminuted fracture and the instability patterns.  I made sure that he was appropriately padded at all body parts.  Time-out was called.  Preop and postop check list completed.  Operation commenced with a dorsal incision.  Once this was accomplished, I performed an EPL decompression and anterior placement in the soft tissue.  Z lengthening of the retinaculum occurred.  Following this, I identified the fracture.  The radial styloid was reduced with Kirschner wire.  Following this, the dorsal Barton-type shear fracture was then reduced as well very carefully.  I identified the posterior interosseous nerve and performed a crushing and cauterization technique to the nerve given its appearance and placement.  Following PI neurectomy and EPL transfer, the patient then had placement of a L-plate to serve this as spring plate dorsally and reduce the dorsal Barton fracture.  This was performed without difficulty.  Following this, I made an incision about the radial styloid region, dissected down, and made sure that we had a perfect fit and anatomic reduction.  I then placed three 0.062 Kirschner wires and clipped them in the subcutaneous tissue.  I felt that the K-wires provided excellent fixation.  He was placed through a full range of motion.  Live fluoro demonstrated good stability in  excellent radial inclination, volar tilt and height.  Distal radioulnar joint was stable.  There was no tendency towards re-subluxation or dislocation about the radiocarpal joint.  I was pleased with this and the findings.  Deflated the tourniquet. Secured hemostasis by bipolar electrocautery and then closed the retinaculum over the plate.  In addition to this, I ensured that the patient also had a small bridge retinaculum dorsally to prevent bowstring.  Hemostasis was adequate.  Wound was closed with 3-0  Vicryl followed by Prolene in subcutaneous tissue.  He tolerated this well.  He had a sugar-tong splint applied.  There were no complicating features. He had soft compartments, good refill, and no issues immediately postop. He will be admitted for IV antibiotics, general postop observation, and continue a very close care to this extremity.  I am going to immobilize him for 4 weeks given the fracture.  We will consider pin removal at 7-8 weeks, strengthening at 8-10 weeks, and gentle interval motion at 6 weeks.  Do's and do not's discussed and all questions encouraged and answered.     Dionne Ano. Amanda Pea, M.D.     [facility]  D:  02/01/2012  T:  02/02/2012  Job:  [id number]

## 2012-02-02 NOTE — Progress Notes (Signed)
Subjective: 1 Day Post-Op Procedure(s) (LRB): OPEN REDUCTION INTERNAL FIXATION (ORIF) WRIST FRACTURE (Right) Patient reports pain as mild.    Objective: Vital signs in last 24 hours: Temp:  [97.7 F (36.5 C)-98.3 F (36.8 C)] 98 F (36.7 C) (04/20 0830) Pulse Rate:  [57-76] 67  (04/20 0830) Resp:  [11-18] 18  (04/20 0830) BP: (110-147)/(61-87) 110/61 mmHg (04/20 0830) SpO2:  [96 %-100 %] 96 % (04/20 0830) Weight:  [122.471 kg (270 lb)] 122.471 kg (270 lb) (04/19 1611)  Intake/Output from previous day: 04/19 0701 - 04/20 0700 In: 1420 [P.O.:120; I.V.:1300] Out: -  Intake/Output this shift:     Basename 01/31/12 1600  HGB 15.2    Basename 01/31/12 1600  WBC 10.5  RBC 5.09  HCT 44.3  PLT 241   No results found for this basename: NA:2,K:2,CL:2,CO2:2,BUN:2,CREATININE:2,GLUCOSE:2,CALCIUM:2 in the last 72 hours No results found for this basename: LABPT:2,INR:2 in the last 72 hours  Neurologically intact ABD soft Neurovascular intact Sensation intact distally Intact pulses distally No cellulitis present Compartment soft  Assessment/Plan: 1 Day Post-Op Procedure(s) (LRB): OPEN REDUCTION INTERNAL FIXATION (ORIF) WRIST FRACTURE (Right) Advance diet DC to home after pm antibiotics Looks good D/w patient all issues and plans Will follow up in 10 days All questions have been answered and encouraged He has excellent range of motion to the fingers compartments are soft and sensation is excellent  Aron Baba M 02/02/2012, 12:35 PM
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
## 2012-02-02 NOTE — Discharge Instructions (Signed)
We recommend that you to take vitamin C 1000 mg a day to promote healing we also recommend that if you require her pain medicine that he take a stool softener to prevent constipation as most pain medicines will have constipation side effects. We recommend either Peri-Colace or Senokot and recommend that you also consider adding MiraLAX to prevent the constipation affects from pain medicine if you are required to use them. These medicines are over the counter and maybe purchased at a local pharmacy.  Keep bandage clean and dry.  Call for any problems.  No smoking.  Criteria for driving a car: you should be off your pain medicine for 7-8 hours, able to drive one handed(confident), thinking clearly and feeling able in your judgement to drive. Continue elevation as it will decrease swelling.  If instructed by MD move your fingers within the confines of the bandage/splint.  Use ice if instructed by your MD. Call immediately for any sudden loss of feeling in your hand/arm or change in functional abilities of the extremity.    Please make sure to keep moving her fingers and strict elevation with ice as needed Call Dr. Amanda Pea for any problems

## 2012-02-08 ENCOUNTER — Encounter (HOSPITAL_COMMUNITY): Payer: Self-pay | Admitting: Orthopedic Surgery

## 2012-03-04 ENCOUNTER — Ambulatory Visit (INDEPENDENT_AMBULATORY_CARE_PROVIDER_SITE_OTHER): Payer: 59 | Admitting: Family Medicine

## 2012-03-04 ENCOUNTER — Encounter: Payer: Self-pay | Admitting: Family Medicine

## 2012-03-04 VITALS — BP 102/68 | HR 91 | Temp 98.2°F | Wt 270.0 lb

## 2012-03-04 DIAGNOSIS — M25569 Pain in unspecified knee: Secondary | ICD-10-CM

## 2012-03-04 DIAGNOSIS — F988 Other specified behavioral and emotional disorders with onset usually occurring in childhood and adolescence: Secondary | ICD-10-CM

## 2012-03-04 DIAGNOSIS — F329 Major depressive disorder, single episode, unspecified: Secondary | ICD-10-CM

## 2012-03-04 MED ORDER — LISDEXAMFETAMINE DIMESYLATE 20 MG PO CAPS: 20.0000 mg | ORAL_CAPSULE | Freq: Every day | ORAL | Status: DC

## 2012-03-04 NOTE — Patient Instructions (Signed)
Don't change your meds.  Call me when you are going to start back on training. The knee pain should gradually get better.
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
## 2012-03-06 DIAGNOSIS — M25569 Pain in unspecified knee: Secondary | ICD-10-CM | POA: Insufficient documentation

## 2012-03-06 NOTE — Discharge Summary (Signed)
[name], [name] NO.:  [id number]  MEDICAL RECORD NO.:  [id number]  LOCATION:                                 FACILITY:  PHYSICIAN:  Dionne Ano. Taariq Leitz, M.D.DATE OF BIRTH:  1985/11/01  DATE OF ADMISSION: DATE OF DISCHARGE:                              DISCHARGE SUMMARY   ADDENDUM  Derek Briggs of course underwent surgical reconstruction by myself for his upper extremity predicament.  In his discharge summary, we did not mark his final diagnosis.  I should note that his final diagnosis is right wrist fracture, status post open reduction and internal fixation.     Dionne Ano. Amanda Pea, M.D.     [facility]  D:  03/06/2012  T:  03/06/2012  Job:  [id number]
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
## 2012-03-06 NOTE — Assessment & Plan Note (Signed)
Continue current meds 

## 2012-03-06 NOTE — Assessment & Plan Note (Signed)
He'll call when he's going to start back on training.  If he's not still improving, he'll notify me.   He wants to hold off on meds and this is reasonable.

## 2012-06-18 ENCOUNTER — Encounter: Payer: Self-pay | Admitting: Family Medicine

## 2012-06-18 ENCOUNTER — Ambulatory Visit (INDEPENDENT_AMBULATORY_CARE_PROVIDER_SITE_OTHER): Payer: 59 | Admitting: Family Medicine

## 2012-06-18 VITALS — BP 114/90 | HR 84 | Temp 97.8°F | Wt 275.2 lb

## 2012-06-18 DIAGNOSIS — IMO0002 Reserved for concepts with insufficient information to code with codable children: Secondary | ICD-10-CM

## 2012-06-18 DIAGNOSIS — N509 Disorder of male genital organs, unspecified: Secondary | ICD-10-CM

## 2012-06-18 LAB — POCT URINALYSIS DIPSTICK
Leukocytes, UA: NEGATIVE
Protein, UA: NEGATIVE
Urobilinogen, UA: 0.2

## 2012-06-18 MED ORDER — NAPROXEN 500 MG PO TABS: ORAL_TABLET | ORAL | Status: AC

## 2012-06-18 MED ORDER — DOXYCYCLINE HYCLATE 100 MG PO CAPS: 100.0000 mg | ORAL_CAPSULE | Freq: Two times a day (BID) | ORAL | Status: AC

## 2012-06-18 NOTE — Addendum Note (Signed)
Addended by: Shon Millet on: 06/18/2012 12:27 PM   Modules accepted: Orders
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
## 2012-06-18 NOTE — Assessment & Plan Note (Addendum)
Anticipate acute epididymitis.  Treat as such with shot of 250mg  CTX and doxy 10 d course. sent off CT/GC urethral probe as well as UCx today. Discussed reasons to call back or to call if worsening for [place]. Pt agrees with plan.

## 2012-06-18 NOTE — Patient Instructions (Addendum)
I think you have epididymitis - treat with shot of ceftriaxone and doxycycline for 10 day course. May use naprosyn for inflammation. Elevate scrotum as able (towel below scrotum when prolonged sitting) If not improving in 1 week, or any worsening, please let me know to schedule ultrasound.  Epididymitis Epididymitis is a swelling (inflammation) of the epididymis. The epididymis is a cord-like structure along the back part of the testicle. Epididymitis is usually, but not always, caused by infection. This is usually a sudden problem beginning with chills, fever and pain behind the scrotum and in the testicle. There may be swelling and redness of the testicle. DIAGNOSIS  Physical examination will reveal a tender, swollen epididymis. Sometimes, cultures are obtained from the urine or from prostate secretions to help find out if there is an infection or if the cause is a different problem. Sometimes, blood work is performed to see if your white blood cell count is elevated and if a germ (bacterial) or viral infection is present. Using this knowledge, an appropriate medicine which kills germs (antibiotic) can be chosen by your caregiver. A viral infection causing epididymitis will most often go away (resolve) without treatment. HOME CARE INSTRUCTIONS   Hot sitz baths for 20 minutes, 4 times per day, may help relieve pain.   Only take over-the-counter or prescription medicines for pain, discomfort or fever as directed by your caregiver.   Take all medicines, including antibiotics, as directed. Take the antibiotics for the full prescribed length of time even if you are feeling better.   It is very important to keep all follow-up appointments.  SEEK IMMEDIATE MEDICAL CARE IF:   You have a fever.   You have pain not relieved with medicines.   You have any worsening of your problems.   Your pain seems to come and go.   You develop pain, redness, and swelling in the scrotum and surrounding areas.    MAKE SURE YOU:   Understand these instructions.   Will watch your condition.   Will get help right away if you are not doing well or get worse.  Document Released: 09/28/2000 Document Revised: 09/20/2011 Document Reviewed: 08/18/2009 [facility] Patient Information 2012 [place], [place].

## 2012-06-19 LAB — GC/CHLAMYDIA PROBE AMP, GENITAL: GC Probe Amp, Genital: NEGATIVE

## 2012-06-20 LAB — URINE CULTURE
Colony Count: NO GROWTH
Organism ID, Bacteria: NO GROWTH

## 2012-09-16 ENCOUNTER — Ambulatory Visit (INDEPENDENT_AMBULATORY_CARE_PROVIDER_SITE_OTHER): Payer: 59 | Admitting: Family Medicine

## 2012-09-16 ENCOUNTER — Telehealth: Payer: Self-pay

## 2012-09-16 ENCOUNTER — Encounter: Payer: Self-pay | Admitting: Family Medicine

## 2012-09-16 VITALS — BP 104/64 | HR 98 | Temp 98.5°F | Wt 281.0 lb

## 2012-09-16 DIAGNOSIS — J45990 Exercise induced bronchospasm: Secondary | ICD-10-CM

## 2012-09-16 MED ORDER — BUDESONIDE-FORMOTEROL FUMARATE 160-4.5 MCG/ACT IN AERO: 1.0000 | INHALATION_SPRAY | Freq: Two times a day (BID) | RESPIRATORY_TRACT | Status: DC | PRN

## 2012-09-16 MED ORDER — AZITHROMYCIN 250 MG PO TABS: ORAL_TABLET | ORAL | Status: DC

## 2012-09-16 MED ORDER — ALBUTEROL SULFATE HFA 108 (90 BASE) MCG/ACT IN AERS: 2.0000 | INHALATION_SPRAY | RESPIRATORY_TRACT | Status: DC | PRN

## 2012-09-16 NOTE — Telephone Encounter (Signed)
For 2 weeks has productive cough with green phlegm; last night vomited due to thick phlegm, chest congestion when takes deep breath has pain on right side of chest.No difficulty breathing, but has SOB on and off; no fever and no head congestion. Pt scheduled appt with Dr Para March on 09/18/12 at 10:15 am. Could not come for appt tomorrow afternoon due to conflicting PT appt. Pt wanted to see if could be worked in at any time today.Please advise.Derek Briggs.Advised pt if condition changed or worsened to call back or go to UC.

## 2012-09-16 NOTE — Telephone Encounter (Signed)
See about the 4:30 slot today.  Thanks.  Cancel the upcoming OV if scheduled for today.

## 2012-09-16 NOTE — Progress Notes (Signed)
Increasing asthma symptoms in last few weeks.  Was needing his SABA more often.  He then had less wheeze/SOB but the cough got worse.  More cough and chest/nasal congestion in last week.  Fever has broken.  Sputum production noted.  Rhinorrhea: no Congestion: some now ear pain:no sore throat: no cough:yes Myalgias: no  ROS: See HPI.  Otherwise negative.    Meds, vitals, and allergies reviewed.   GEN: nad, alert and oriented HEENT: mucous membranes moist, TM w/o erythema, nasal epithelium injected, OP with cobblestoning NECK: supple w/o LA CV: rrr. PULM: ctab, no inc wob, no wheeze ABD: soft, +bs EXT: no edema

## 2012-09-16 NOTE — Telephone Encounter (Signed)
LMOVM of alternate number to call back.  No VM available on primary call back number.

## 2012-09-16 NOTE — Telephone Encounter (Signed)
Scheduled appt at 4:30 pm.

## 2012-09-16 NOTE — Patient Instructions (Addendum)
Use the inhalers and if you have a fever or increased cough then start the antibiotics.  This should get better.

## 2012-09-17 ENCOUNTER — Ambulatory Visit: Payer: 59 | Admitting: Family Medicine

## 2012-09-17 NOTE — Assessment & Plan Note (Signed)
With recent cough and sputum production.  Would continue SABA and symbicort but would hold off on abx at this point as nontoxic and lungs completely ctab at this point.  If inc in cough or not improved in next few days, then fill zmax and f/u prn.  He agrees.  Should resolve.  Supportive care o/w.  Flu shot encouraged when feeling well.

## 2012-09-18 ENCOUNTER — Ambulatory Visit: Payer: 59 | Admitting: Family Medicine
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
## 2013-09-21 ENCOUNTER — Other Ambulatory Visit: Payer: Self-pay | Admitting: Family Medicine

## 2013-09-21 NOTE — Telephone Encounter (Signed)
Pt states he is still waiting on this to be filled

## 2013-09-21 NOTE — Telephone Encounter (Signed)
Electronic refill request. Patient has not been seen in greater than 1 year with no upcoming appt scheduled.  According to refill protocol, please advise.  

## 2013-09-22 NOTE — Telephone Encounter (Signed)
Patient advised.

## 2013-09-22 NOTE — Telephone Encounter (Signed)
See about getting him scheduled in the next 1-2 months.  rx sent. Thanks.

## 2014-03-10 IMAGING — CR DG CHEST 2V
2 series · 2 of 2 positions shown · non-contrast
Comparison: 01/17/2005

CLINICAL DATA: Preop evaluation for wrist surgery, history of
asthma

CHEST - 2 VIEW

[view not recorded (1 of 2)]
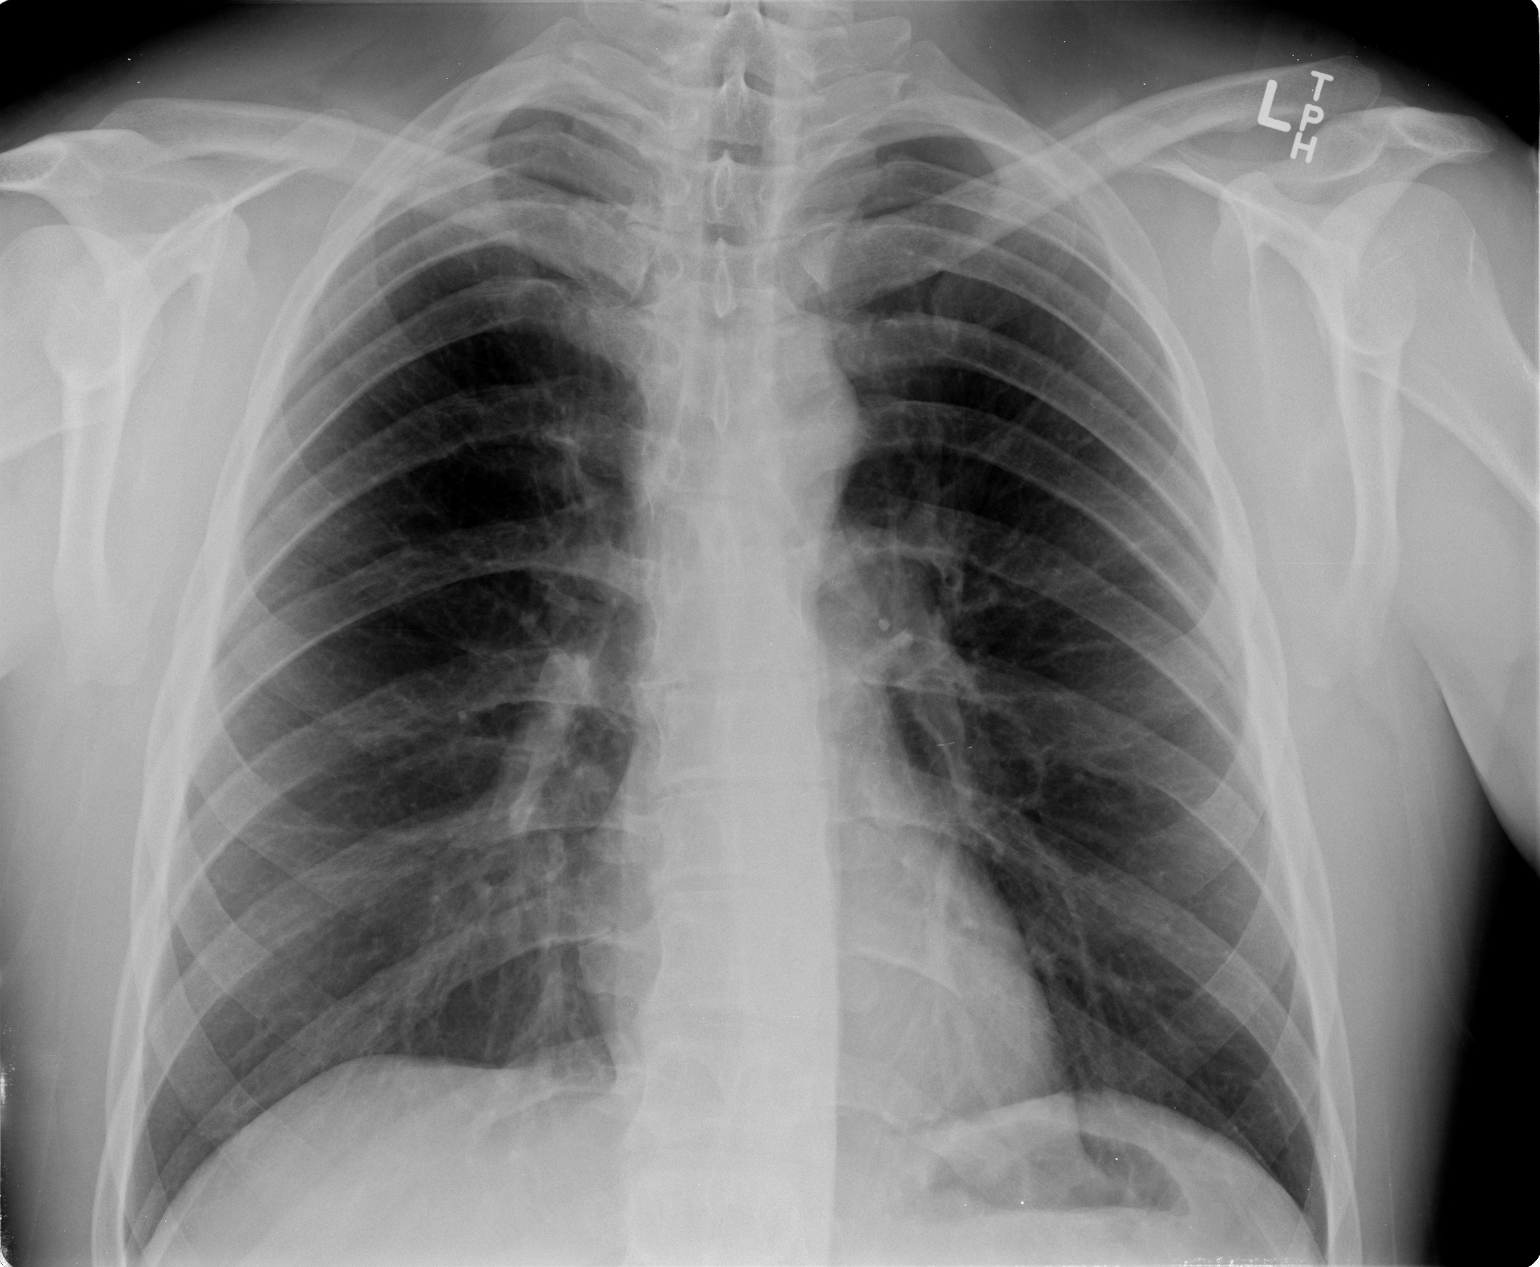

[view not recorded (2 of 2)]
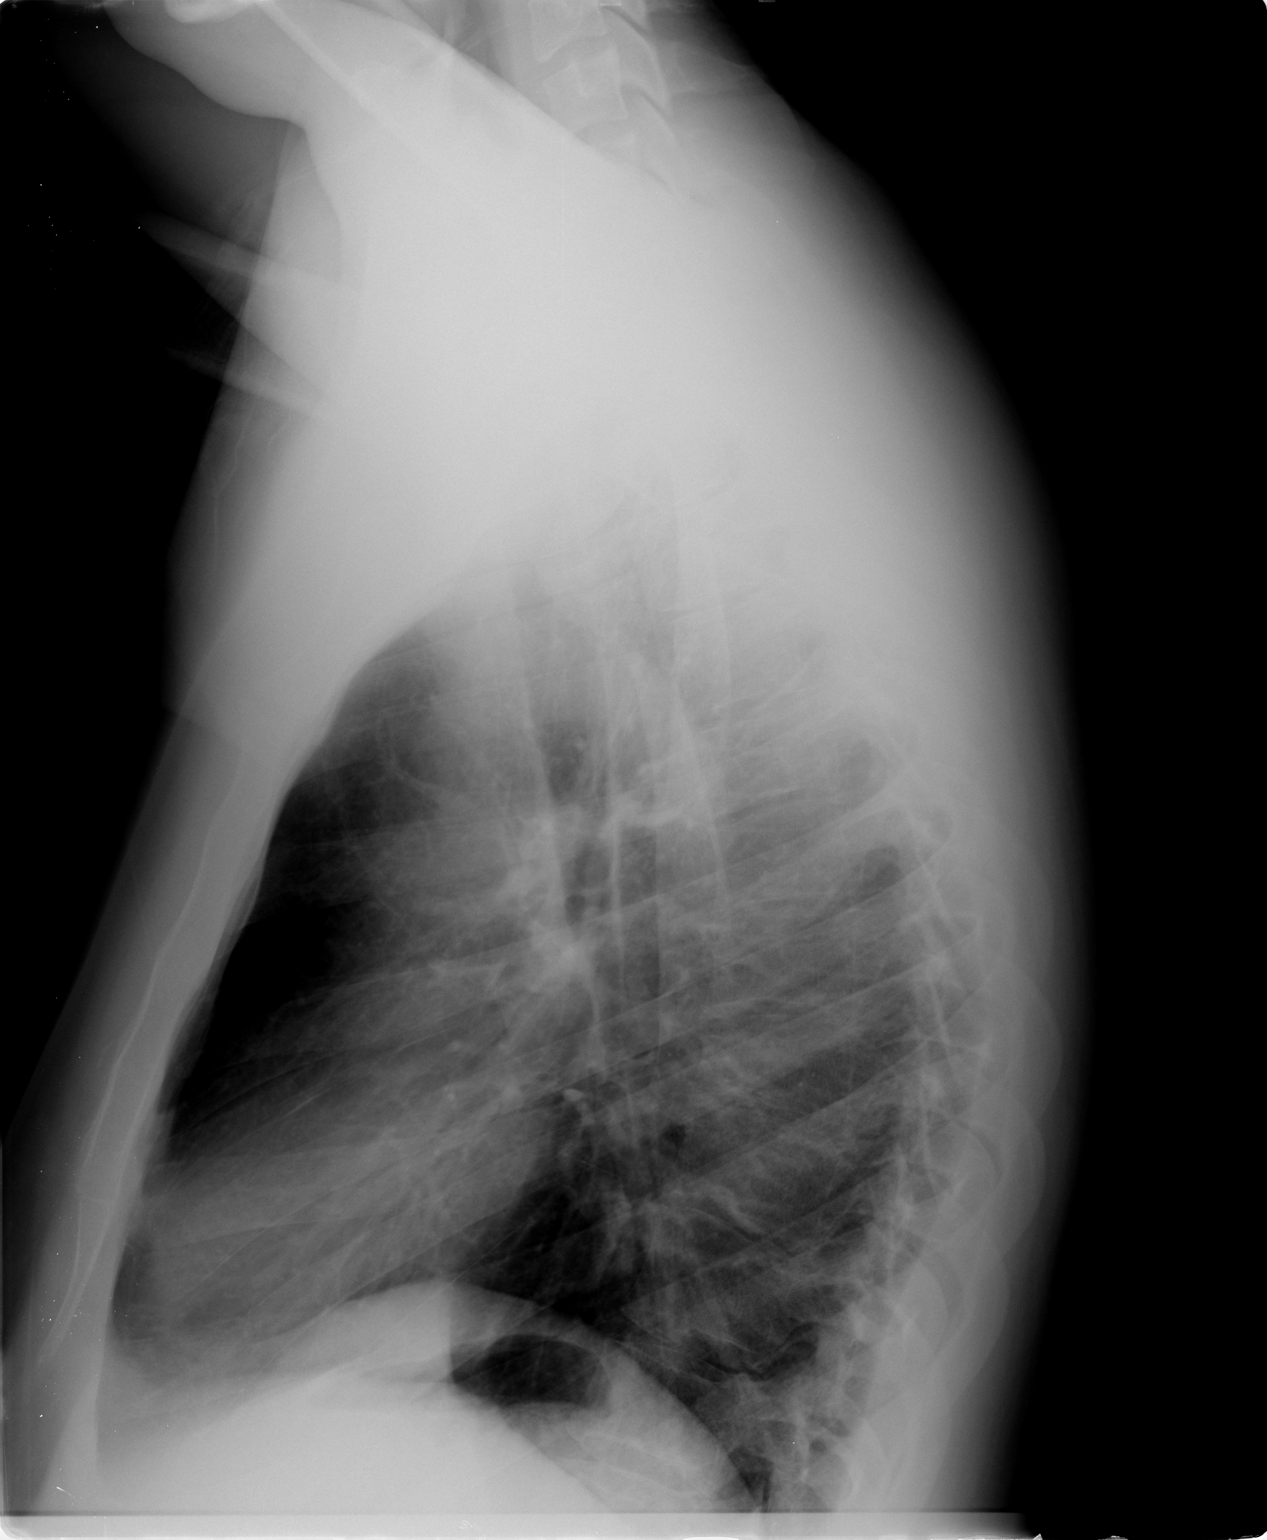

[2 of 2 positions shown; findings below may reference images not displayed]

FINDINGS: Normal heart size and vascularity.  No focal pneumonia,
collapse, consolidation, effusion, or pneumothorax.  Trachea
midline.
IMPRESSION: No acute chest finding

## 2018-01-17 ENCOUNTER — Ambulatory Visit: Payer: BLUE CROSS/BLUE SHIELD | Admitting: Family Medicine

## 2018-01-17 ENCOUNTER — Ambulatory Visit: Payer: Self-pay

## 2018-01-17 ENCOUNTER — Encounter: Payer: Self-pay | Admitting: Family Medicine

## 2018-01-17 DIAGNOSIS — R05 Cough: Secondary | ICD-10-CM | POA: Diagnosis not present

## 2018-01-17 DIAGNOSIS — R059 Cough, unspecified: Secondary | ICD-10-CM

## 2018-01-17 MED ORDER — BUDESONIDE-FORMOTEROL FUMARATE 160-4.5 MCG/ACT IN AERO: INHALATION_SPRAY | RESPIRATORY_TRACT | 5 refills | Status: DC

## 2018-01-17 MED ORDER — AZITHROMYCIN 250 MG PO TABS: ORAL_TABLET | ORAL | 0 refills | Status: AC

## 2018-01-17 MED ORDER — PREDNISONE 10 MG PO TABS: ORAL_TABLET | ORAL | 0 refills | Status: AC

## 2018-01-17 MED ORDER — ALBUTEROL SULFATE HFA 108 (90 BASE) MCG/ACT IN AERS: INHALATION_SPRAY | RESPIRATORY_TRACT | 1 refills | Status: DC

## 2018-01-17 NOTE — Telephone Encounter (Signed)
[facility]) and he said pt had not been able to rest last night due to asthma; Brett CanalesSteve said breathing is not bad enough to go to ED but if pt condition worsens prior to appt today at 2:30 pt will go to ED. FYI to Dr Para Marchuncan.

## 2018-01-17 NOTE — Progress Notes (Signed)
D/w pt about dipping tobacco and he is trying to wean off/quit.  D/w pt about options.  He may end up quitting cold [place].    Cough.  Off both inhalers.  Sick for about 2-3 days.  Started with chest congestion. Then had sig SOB Wed night.  Chest is sore from coughing.  Sx worse again last night.  Sinus pain worse this AM.  No fevers.  No vomiting.  No chills.  Rhinorrhea.  No ear pain.  No ST.    Meds, vitals, and allergies reviewed.   ROS: Per HPI unless specifically indicated in ROS section   GEN: nad, alert and oriented HEENT: mucous membranes moist, tm w/o erythema, nasal exam w/o erythema, clear discharge noted,  OP with cobblestoning NECK: supple w/o LA CV: rrr.   PULM: he has L>R exp wheeze with some rhonchi but o/w ctab, no inc wob, no focal dec in BS EXT: no edema SKIN: well perfused.   Sinuses not ttp

## 2018-01-17 NOTE — Telephone Encounter (Signed)
Thanks Agree 

## 2018-01-17 NOTE — Patient Instructions (Addendum)
If you are going to try the nicotine patch, then get the 7mg .   Start zithromax, take prednisone with food.  Get back on your inhalers and update me as needed.

## 2018-01-17 NOTE — Telephone Encounter (Signed)
Patient called with c/o "difficulty breathing."  He says "the congestion and difficulty getting my breath started about 2 nights ago. I hear wheezing at night and it's hard to breathe laying down. Right now, my breath is short, I have the feeling to take a deep breath once in a while. I would say the SOB is mild to moderate. This usually happens during the spring and fall with all the pollen." I asked is anything coming up with the cough or when he blow his nose, he says "green out my nose and my chest. I never had a fever, body aches, or anything else, just difficult breathing."  According to protocol, see PCP within 4 hours, edited to see within 24 hours, appointment scheduled today at 1430 with Dr. Para Marchuncan, care advice given. Patient verbalized understanding.   Reason for Disposition . [1] MILD difficulty breathing (e.g., minimal/no SOB at rest, SOB with walking, pulse <100) AND [2] NEW-onset or WORSE than normal  Answer Assessment - Initial Assessment Questions 1. RESPIRATORY STATUS: "Describe your breathing?" (e.g., wheezing, shortness of breath, unable to speak, severe coughing)      Short breath, take deep breath every once in a while 2. ONSET: "When did this breathing problem begin?"      2 nights ago 3. PATTERN "Does the difficult breathing come and go, or has it been constant since it started?"      Come and go 4. SEVERITY: "How bad is your breathing?" (e.g., mild, moderate, severe)    - MILD: No SOB at rest, mild SOB with walking, speaks normally in sentences, can lay down, no retractions, pulse < 100.    - MODERATE: SOB at rest, SOB with minimal exertion and prefers to sit, cannot lie down flat, speaks in phrases, mild retractions, audible wheezing, pulse 100-120.    - SEVERE: Very SOB at rest, speaks in single words, struggling to breathe, sitting hunched forward, retractions, pulse > 120      Mild to moderate 5. RECURRENT SYMPTOM: "Have you had difficulty breathing before?" If so, ask:  "When was the last time?" and "What happened that time?"      Yes, during spring and fall 6. CARDIAC HISTORY: "Do you have any history of heart disease?" (e.g., heart attack, angina, bypass surgery, angioplasty)      No 7. LUNG HISTORY: "Do you have any history of lung disease?"  (e.g., pulmonary embolus, asthma, emphysema)     Asthma 8. CAUSE: "What do you think is causing the breathing problem?"      Change in seasons, allergy flare 9. OTHER SYMPTOMS: "Do you have any other symptoms? (e.g., dizziness, runny nose, cough, chest pain, fever)     Cough-white to green today; nasal congestion-green mucus 10. PREGNANCY: "Is there any chance you are pregnant?" "When was your last menstrual period?"       N/A 11. TRAVEL: "Have you traveled out of the country in the last month?" (e.g., travel history, exposures)       No  Protocols used: BREATHING DIFFICULTY-A-AH

## 2018-01-18 DIAGNOSIS — R05 Cough: Secondary | ICD-10-CM | POA: Insufficient documentation

## 2018-01-18 DIAGNOSIS — R059 Cough, unspecified: Secondary | ICD-10-CM | POA: Insufficient documentation

## 2018-01-18 NOTE — Assessment & Plan Note (Signed)
Start zithromax, take prednisone with food.  Routine cautions d/w pt.  Restart baseline symbicort with prn SABA with routine cautions. and update me as needed.   Okay for outpatient f/u.  He agrees.

## 2018-03-23 ENCOUNTER — Other Ambulatory Visit: Payer: Self-pay | Admitting: Family Medicine

## 2018-03-23 DIAGNOSIS — Z131 Encounter for screening for diabetes mellitus: Secondary | ICD-10-CM

## 2018-03-24 ENCOUNTER — Other Ambulatory Visit: Payer: BLUE CROSS/BLUE SHIELD

## 2018-03-27 ENCOUNTER — Encounter: Payer: BLUE CROSS/BLUE SHIELD | Admitting: Family Medicine

## 2018-03-27 DIAGNOSIS — Z0289 Encounter for other administrative examinations: Secondary | ICD-10-CM

## 2018-09-16 ENCOUNTER — Other Ambulatory Visit: Payer: Self-pay | Admitting: Family Medicine

## 2018-09-16 NOTE — Telephone Encounter (Signed)
Electronic refill request. Albuterol Last office visit:   01/17/18 Last Filled:   18 g Refills: 1 Start: 01/17/2018 Please advise.

## 2018-09-17 NOTE — Telephone Encounter (Signed)
Left message for patient to call back  

## 2018-09-17 NOTE — Telephone Encounter (Signed)
Sent.  F/u when possible.  Thanks.

## 2018-09-17 NOTE — Telephone Encounter (Signed)
Please call patient and schedule appointment as instructed. 

## 2018-09-19 NOTE — Telephone Encounter (Signed)
Appointment made for 12/02/17

## 2018-11-22 ENCOUNTER — Other Ambulatory Visit: Payer: Self-pay | Admitting: Family Medicine

## 2018-12-02 ENCOUNTER — Ambulatory Visit: Payer: BLUE CROSS/BLUE SHIELD | Admitting: Family Medicine
